# Patient Record
Sex: Female | Born: 1995 | Race: White | Hispanic: No | Smoking: Never smoker
Health system: Southern US, Community
[De-identification: ages and names within clinical notes are randomized; demographics above are authoritative.]

---

## 2013-09-29 DIAGNOSIS — F32A Depression, unspecified: Secondary | ICD-10-CM | POA: Insufficient documentation

## 2020-10-22 DIAGNOSIS — Z8759 Personal history of other complications of pregnancy, childbirth and the puerperium: Secondary | ICD-10-CM | POA: Insufficient documentation

## 2020-10-22 DIAGNOSIS — A6000 Herpesviral infection of urogenital system, unspecified: Secondary | ICD-10-CM | POA: Insufficient documentation

## 2020-10-30 DIAGNOSIS — Z1589 Genetic susceptibility to other disease: Secondary | ICD-10-CM | POA: Insufficient documentation

## 2020-11-01 ENCOUNTER — Other Ambulatory Visit: Payer: Self-pay

## 2020-11-01 ENCOUNTER — Ambulatory Visit (INDEPENDENT_AMBULATORY_CARE_PROVIDER_SITE_OTHER): Payer: Medicaid Other

## 2020-11-01 VITALS — BP 122/77 | HR 95 | Wt 176.0 lb

## 2020-11-01 DIAGNOSIS — Z3201 Encounter for pregnancy test, result positive: Secondary | ICD-10-CM | POA: Diagnosis not present

## 2020-11-01 LAB — POCT URINE PREGNANCY: Preg Test, Ur: POSITIVE — AB

## 2020-11-01 NOTE — Progress Notes (Signed)
Patient presents for UPT and it is positive. Patient has been seen at [facility] in [place] for new ob intake but didn't like the care she received there. Patient has NEw ob scheduled with [place] Nov 11. Patient given new ob packet and advised she can go to [facility] in [place] if she needs anything before her appointment. Patient states understanding. Armandina Stammer RN

## 2020-11-01 NOTE — Progress Notes (Signed)
Pt presents for UPT.

## 2020-11-28 ENCOUNTER — Other Ambulatory Visit (HOSPITAL_COMMUNITY)
Admission: RE | Admit: 2020-11-28 | Discharge: 2020-11-28 | Disposition: A | Discharge status: Home / Self Care | Payer: Medicaid Other | Source: Ambulatory Visit | Attending: Family Medicine | Admitting: Family Medicine

## 2020-11-28 ENCOUNTER — Encounter: Payer: Self-pay | Admitting: Family Medicine

## 2020-11-28 ENCOUNTER — Ambulatory Visit (INDEPENDENT_AMBULATORY_CARE_PROVIDER_SITE_OTHER): Payer: Medicaid Other | Admitting: Family Medicine

## 2020-11-28 ENCOUNTER — Other Ambulatory Visit: Payer: Self-pay

## 2020-11-28 ENCOUNTER — Encounter: Payer: Self-pay | Admitting: General Practice

## 2020-11-28 VITALS — BP 112/71 | HR 89 | Ht 60.0 in | Wt 177.0 lb

## 2020-11-28 DIAGNOSIS — Z8759 Personal history of other complications of pregnancy, childbirth and the puerperium: Secondary | ICD-10-CM

## 2020-11-28 DIAGNOSIS — O09899 Supervision of other high risk pregnancies, unspecified trimester: Secondary | ICD-10-CM | POA: Diagnosis present

## 2020-11-28 DIAGNOSIS — Z98891 History of uterine scar from previous surgery: Secondary | ICD-10-CM

## 2020-11-28 DIAGNOSIS — O0990 Supervision of high risk pregnancy, unspecified, unspecified trimester: Secondary | ICD-10-CM | POA: Insufficient documentation

## 2020-11-28 DIAGNOSIS — Z1589 Genetic susceptibility to other disease: Secondary | ICD-10-CM | POA: Diagnosis present

## 2020-11-28 DIAGNOSIS — B009 Herpesviral infection, unspecified: Secondary | ICD-10-CM

## 2020-11-28 DIAGNOSIS — Z3A10 10 weeks gestation of pregnancy: Secondary | ICD-10-CM

## 2020-11-28 DIAGNOSIS — O0991 Supervision of high risk pregnancy, unspecified, first trimester: Secondary | ICD-10-CM | POA: Diagnosis not present

## 2020-11-28 DIAGNOSIS — O09891 Supervision of other high risk pregnancies, first trimester: Secondary | ICD-10-CM | POA: Diagnosis not present

## 2020-11-28 DIAGNOSIS — Z8632 Personal history of gestational diabetes: Secondary | ICD-10-CM

## 2020-11-28 NOTE — Progress Notes (Signed)
Subjective:  Jeanette Taylor is a G2P1001 [redacted]w[redacted]d by [place] today being seen today for her first obstetrical visit.  Her obstetrical history is significant for  prior cesarean section after induction of labor due to uncontrolled A2 GDM on insulin. Had NRFHT and intrauterine inflammation and/or infection and was treated with antibiotics.  Placenta pathology showed area of infarct. Patient did have blood testing at last appt to check for hypercoagulability - was heterozygous for MTHFR. No h/o DVE. Patient does intend to breast feed. Pregnancy history fully reviewed.  Patient reports no complaints.  BP 112/71   Pulse 89   Ht 5' (1.524 m)   Wt 177 lb (80.3 kg)   LMP 08/31/2020   Breastfeeding Unknown   BMI 34.57 kg/m   HISTORY: OB History  Gravida Para Term Preterm AB Living  2 1 1     1   SAB IAB Ectopic Multiple Live Births          1    # Outcome Date GA Lbr Len/2nd Weight Sex Delivery Anes PTL Lv  2 Current           1 Term 2021 [redacted]w[redacted]d   M CS-LTranv EPI N LIV    History reviewed. No pertinent past medical history.  Past Surgical History:  Procedure Laterality Date   CESAREAN SECTION      No family history on file.   Exam  BP 112/71   Pulse 89   Ht 5' (1.524 m)   Wt 177 lb (80.3 kg)   LMP 08/31/2020   Breastfeeding Unknown   BMI 34.57 kg/m   Chaperone present during exam  CONSTITUTIONAL: Well-developed, well-nourished female in no acute distress.  HENT:  Normocephalic, atraumatic, External right and left ear normal. Oropharynx is clear and moist EYES: Conjunctivae and EOM are normal. Pupils are equal, round, and reactive to light. No scleral icterus.  NECK: Normal range of motion, supple, no masses.  Normal thyroid.  CARDIOVASCULAR: Normal heart rate noted, regular rhythm RESPIRATORY: Clear to auscultation bilaterally. Effort and breath sounds normal, no problems with respiration noted. BREASTS: Symmetric in size. No masses, skin changes, nipple drainage, or  lymphadenopathy. ABDOMEN: Soft, normal bowel sounds, no distention noted.  No tenderness, rebound or guarding.  PELVIC: Normal appearing external genitalia; normal appearing vaginal mucosa and cervix. No abnormal discharge noted.  MUSCULOSKELETAL: Normal range of motion. No tenderness.  No cyanosis, clubbing, or edema.  2+ distal pulses. SKIN: Skin is warm and dry. No rash noted. Not diaphoretic. No erythema. No pallor. NEUROLOGIC: Alert and oriented to person, place, and time. Normal reflexes, muscle tone coordination. No cranial nerve deficit noted. PSYCHIATRIC: Normal mood and affect. Normal behavior. Normal judgment and thought content.    Assessment:    Pregnancy: G2P1001 Patient Active Problem List   Diagnosis Date Noted   Supervision of other high risk pregnancies, unspecified trimester 11/28/2020   History of gestational diabetes 11/28/2020   Heterozygous MTHFR mutation C677T 10/30/2020   Genital herpes simplex 10/22/2020   History of placental abnormality 10/22/2020   Anxiety and depression 09/29/2013      Plan:   1. Supervision of high risk pregnancy, antepartum FHT normal. 11/29/2013 showed EGA [redacted]weeks. Will change EDC.  Initial labs obtained Continue prenatal vitamins Reviewed n/v relief measures and warning s/s to report Reviewed recommended weight gain based on pre-gravid BMI Encouraged well-balanced diet Genetic & carrier screening discussed: requests Panorama,  Ultrasound discussed; fetal survey: ordered CCNC completed> form faxed if has or is planning to apply  for medicaid The nature of [place] [facility] [employer] with multiple MDs and other Advanced Practice Providers was explained to patient; also emphasized that fellows, residents, and students are part of our team. - Urine Culture - Cytology - PAP( [place]) - [facility] AMB BABYSCRIPTS OPT IN - Protein / creatinine ratio, urine - TSH - [place] MFM OB DETAIL +14 WK; Future - Genetic Screening  2.  Heterozygous MTHFR mutation C677T Discussed with MFM due to h/o placental infarct. Recommended no anticoag for ppx antepartum. Will need postpartum ppx after cesarean delivery. - Urine Culture - Cytology - PAP( [place]) - [facility] AMB BABYSCRIPTS OPT IN - Protein / creatinine ratio, urine - TSH - [place] MFM OB DETAIL +14 WK; Future - Genetic Screening  3. History of placental abnormality H/o placental infarct  4. Supervision of other high risk pregnancies, unspecified trimester - Urine Culture - Cytology - PAP( ) - [facility] AMB BABYSCRIPTS OPT IN - Protein / creatinine ratio, urine - TSH - [place] MFM OB DETAIL +14 WK; Future - Genetic Screening  5. HSV infection Ppx at 35 weeks  6. History of gestational diabetes HgA1c 5.6 last month. Will check early 2hr GTT. Start ASA 81mg .  7. History of Cesarean delivery  Desires repeat  Problem list reviewed and updated. 75% of 30 min visit spent on counseling and coordination of care.     11/28/2020

## 2020-11-28 NOTE — Progress Notes (Signed)
DATING AND VIABILITY SONOGRAM   Jeanette Taylor is a 25 y.o. year old G2P1001 with LMP Patient's last menstrual period was 08/31/2020. which would correlate to  [redacted]w[redacted]d weeks gestation.  She has irregular menstrual cycles.   She is here today for a confirmatory initial sonogram.    GESTATION: SINGLETON   FETAL ACTIVITY:          Heart rate         180 bpm          The fetus is active.    ADNEXA: The ovaries are normal.   GESTATIONAL AGE AND  BIOMETRICS:  Gestational criteria: Estimated Date of Delivery: 06/25/21 by LMP now at61w5d  Previous Scans:0      CROWN RUMP LENGTH         3.19 cm         10.1 weeks   3.30 cm   10.1 weeks   2.84 cm   9.5 weeks                                                                       AVERAGE EGA(BY THIS SCAN):  10.1 weeks  WORKING EDD( early ultrasound ):  06-25-2021     TECHNICIAN COMMENTS: atient informed that the ultrasound is considered a limited obstetric ultrasound and is not intended to be a complete ultrasound exam.  Patient also informed that the ultrasound is not being completed with the intent of assessing for fetal or placental anomalies or any pelvic abnormalities. Explained that the purpose of today's ultrasound is to assess for fetal heart rate.  Patient acknowledges the purpose of the exam and the limitations of the study.    Ultrasound order not placed in PACS/Canopy/AS due to order not flowing over to ultrasound machine   Armandina Stammer 11/28/2020 9:52 AM

## 2020-11-29 LAB — PROTEIN / CREATININE RATIO, URINE
Creatinine, Urine: 38.2 mg/dL
Protein, Ur: 5.9 mg/dL
Protein/Creat Ratio: 154 mg/g creat (ref 0–200)

## 2020-11-29 LAB — CYTOLOGY - PAP
Chlamydia: NEGATIVE
Comment: NEGATIVE
Comment: NORMAL
Diagnosis: NEGATIVE
Neisseria Gonorrhea: NEGATIVE

## 2020-11-29 LAB — TSH: TSH: 2.63 u[IU]/mL (ref 0.450–4.500)

## 2020-12-04 LAB — URINE CULTURE

## 2020-12-05 ENCOUNTER — Encounter: Payer: Self-pay | Admitting: General Practice

## 2020-12-09 ENCOUNTER — Other Ambulatory Visit (INDEPENDENT_AMBULATORY_CARE_PROVIDER_SITE_OTHER): Payer: Medicaid Other

## 2020-12-09 ENCOUNTER — Other Ambulatory Visit: Payer: Self-pay

## 2020-12-09 DIAGNOSIS — Z131 Encounter for screening for diabetes mellitus: Secondary | ICD-10-CM

## 2020-12-09 DIAGNOSIS — Z3A11 11 weeks gestation of pregnancy: Secondary | ICD-10-CM

## 2020-12-09 DIAGNOSIS — R7309 Other abnormal glucose: Secondary | ICD-10-CM

## 2020-12-09 NOTE — Progress Notes (Signed)
Pt presents for early 2 hr GTT due to elevated Hemoglobin A1C. Pt was sent to the lab for her 2 hr GTT.  Jeanette Taylor l Idamae Coccia, CMA

## 2020-12-10 LAB — GLUCOSE TOLERANCE, 2 HOURS W/ 1HR
Glucose, 1 hour: 190 mg/dL — ABNORMAL HIGH (ref 70–179)
Glucose, 2 hour: 159 mg/dL — ABNORMAL HIGH (ref 70–152)
Glucose, Fasting: 93 mg/dL — ABNORMAL HIGH (ref 70–91)

## 2020-12-11 ENCOUNTER — Other Ambulatory Visit: Payer: Medicaid Other

## 2020-12-11 ENCOUNTER — Encounter: Payer: Self-pay | Admitting: Obstetrics & Gynecology

## 2020-12-11 DIAGNOSIS — O24419 Gestational diabetes mellitus in pregnancy, unspecified control: Secondary | ICD-10-CM | POA: Insufficient documentation

## 2020-12-16 ENCOUNTER — Telehealth: Payer: Self-pay

## 2020-12-16 DIAGNOSIS — O99810 Abnormal glucose complicating pregnancy: Secondary | ICD-10-CM

## 2020-12-16 DIAGNOSIS — O24419 Gestational diabetes mellitus in pregnancy, unspecified control: Secondary | ICD-10-CM

## 2020-12-16 MED ORDER — ACCU-CHEK SOFTCLIX LANCETS MISC: 1.0000 | Freq: Four times a day (QID) | 3 refills | Status: DC

## 2020-12-16 MED ORDER — ACCU-CHEK GUIDE W/DEVICE KIT: 1.0000 | PACK | Freq: Four times a day (QID) | 0 refills | Status: AC

## 2020-12-16 MED ORDER — ACCU-CHEK GUIDE VI STRP: ORAL_STRIP | 12 refills | Status: DC

## 2020-12-16 NOTE — Telephone Encounter (Signed)
Left message for patient to return call to office.  Referral placed. Diabetes supplies sent to pharmacy. Armandina Stammer RN

## 2020-12-16 NOTE — Telephone Encounter (Signed)
-----   Message from Willodean Rosenthal, MD sent at 12/11/2020  4:51 PM EST ----- Please call pt. She has GDM. She needs referral to DM educator and nutritionist. She needs to begins glc checks.   Thanks,   Clh-S

## 2020-12-25 ENCOUNTER — Other Ambulatory Visit: Payer: Self-pay

## 2020-12-25 ENCOUNTER — Ambulatory Visit: Payer: Medicaid Other | Admitting: Registered"

## 2020-12-25 ENCOUNTER — Encounter: Payer: Medicaid Other | Attending: Obstetrics & Gynecology | Admitting: Registered"

## 2020-12-25 DIAGNOSIS — Z3A14 14 weeks gestation of pregnancy: Secondary | ICD-10-CM | POA: Diagnosis not present

## 2020-12-25 DIAGNOSIS — O09892 Supervision of other high risk pregnancies, second trimester: Secondary | ICD-10-CM | POA: Diagnosis present

## 2020-12-25 DIAGNOSIS — O24419 Gestational diabetes mellitus in pregnancy, unspecified control: Secondary | ICD-10-CM | POA: Diagnosis not present

## 2020-12-25 DIAGNOSIS — Z0130 Encounter for examination of blood pressure without abnormal findings: Secondary | ICD-10-CM | POA: Diagnosis not present

## 2020-12-25 NOTE — Progress Notes (Signed)
Patient was seen for Gestational Diabetes self-management on 12/26/20  Start time 1020 and End time 1120   Estimated due date: 06/25/21; [redacted]w[redacted]d  Clinical: Medications: aspirin, prenatal vitamin Medical History: previous insulin controlled GDM 2 yrs ago Labs: OGTT [phone], A1c n/a%   Dietary and Lifestyle History: Pt states she recently moved to the area. Pt reports last pregnancy she was told that she has to eat meals and snacks on a very strict schedule even when not hungry and always include carbs. Pt states she had to use a lot of insulin to control blood sugar. Pt states she did not do 6 wk postpartum f/u last pregnancy.  Patient states she is intereted in CGM and options for insulin. RD discussed Dexcom and Omnipod.  Pt reports could not tolerate metformin.   Pt states she is tired and sleeps a lot.  Pt reports prior to previous pregnancy she followed a keto diet for about 6 months and lost weight, but it was difficult to do and was not sustainable.  Physical Activity: not assessed Stress: not assessed Sleep: not assessed  24 hr Recall: not assessed First Meal: Snack: Second meal: Snack: Third meal: Snack: Beverages:  NUTRITION INTERVENTION  Nutrition education (E-1) on the following topics:   Initial Follow-up  [x]  []  Definition of Gestational Diabetes [x]  []  Why dietary management is important in controlling blood glucose [x]  []  Effects each nutrient has on blood glucose levels [x]  []  Simple carbohydrates vs complex carbohydrates [x]  []  Fluid intake [x]  []  Creating a balanced meal plan [x]  []  Carbohydrate counting  [x]  []  When to check blood glucose levels [x]  []  Proper blood glucose monitoring techniques [x]  []  Effect of stress and stress reduction techniques  [x]  []  Exercise effect on blood glucose levels, appropriate exercise during pregnancy [x]  []  Importance of limiting caffeine and abstaining from alcohol and smoking [x]  []  Medications used for blood  sugar control during pregnancy [x]  []  Hypoglycemia and rule of 15 [x]  []  Postpartum self care  Blood glucose monitor given: Patient already has a meter, is  testing pre breakfast and 2 hours after each meal.  Started Dexcom sample: Lot # , Exp 09/16/2021  Patient instructed to monitor glucose levels: FBS: 60 - ? 95 mg/dL (some clinics use 90 for cutoff) 1 hour: ? 140 mg/dL 2 hour: ? mg/dL  Patient received handouts: Nutrition Diabetes and Pregnancy Carbohydrate Counting List  Patient will be seen for follow-up as needed.

## 2020-12-26 ENCOUNTER — Encounter: Payer: Self-pay | Admitting: Obstetrics and Gynecology

## 2020-12-26 ENCOUNTER — Ambulatory Visit (INDEPENDENT_AMBULATORY_CARE_PROVIDER_SITE_OTHER): Payer: Medicaid Other | Admitting: Obstetrics and Gynecology

## 2020-12-26 ENCOUNTER — Other Ambulatory Visit (HOSPITAL_COMMUNITY)
Admission: RE | Admit: 2020-12-26 | Discharge: 2020-12-26 | Disposition: A | Discharge status: Home / Self Care | Payer: Medicaid Other | Source: Ambulatory Visit | Attending: Obstetrics and Gynecology | Admitting: Obstetrics and Gynecology

## 2020-12-26 VITALS — BP 100/60 | HR 86 | Wt 177.0 lb

## 2020-12-26 DIAGNOSIS — Z3A14 14 weeks gestation of pregnancy: Secondary | ICD-10-CM

## 2020-12-26 DIAGNOSIS — O09899 Supervision of other high risk pregnancies, unspecified trimester: Secondary | ICD-10-CM

## 2020-12-26 DIAGNOSIS — O24419 Gestational diabetes mellitus in pregnancy, unspecified control: Secondary | ICD-10-CM

## 2020-12-26 DIAGNOSIS — O34219 Maternal care for unspecified type scar from previous cesarean delivery: Secondary | ICD-10-CM | POA: Insufficient documentation

## 2020-12-26 DIAGNOSIS — A6000 Herpesviral infection of urogenital system, unspecified: Secondary | ICD-10-CM

## 2020-12-26 NOTE — Progress Notes (Signed)
   PRENATAL VISIT NOTE  Subjective:  Jeanette Taylor is a 25 y.o. G2P1001 at [redacted]w[redacted]d being seen today for ongoing prenatal care.  She is currently monitored for the following issues for this high-risk pregnancy and has Anxiety and depression; Genital herpes simplex; Heterozygous MTHFR mutation C677T; History of placental abnormality; Supervision of other high risk pregnancies, unspecified trimester; History of gestational diabetes; Gestational diabetes; and Previous cesarean delivery affecting pregnancy on their problem list.  Patient reports no complaints.  Contractions: Not present. Vag. Bleeding: None.  Movement: Absent. Denies leaking of fluid.   The following portions of the patient's history were reviewed and updated as appropriate: allergies, current medications, past family history, past medical history, past social history, past surgical history and problem list.   Objective:   Vitals:   12/26/20 1603  BP: 100/60  Pulse: 86  Weight: 177 lb (80.3 kg)    Fetal Status: Fetal Heart Rate (bpm): 160   Movement: Absent     General:  Alert, oriented and cooperative. Patient is in no acute distress.  Skin: Skin is warm and dry. No rash noted.   Cardiovascular: Normal heart rate noted  Respiratory: Normal respiratory effort, no problems with respiration noted  Abdomen: Soft, gravid, appropriate for gestational age.  Pain/Pressure: Absent     Pelvic: Cervical exam deferred        Extremities: Normal range of motion.  Edema: None  Mental Status: Normal mood and affect. Normal behavior. Normal judgment and thought content.   Assessment and Plan:  Pregnancy: G2P1001 at [redacted]w[redacted]d 1. [redacted] weeks gestation of pregnancy  2. Supervision of other high risk pregnancies, unspecified trimester Patient is doing well without complaints Anatomy ultrasound scheduled   3. Gestational diabetes mellitus (GDM), antepartum, gestational diabetes method of control unspecified Patient seen by diabetic educator  yesterday Will have patient return with log in 2 weeks for review Patient is not interested in starting Metformin if indicated and would rather start insulin Fetal echo ordered  4. Previous cesarean delivery affecting pregnancy   5. Genital herpes simplex, unspecified site Prophylaxis if labor  Preterm labor symptoms and general obstetric precautions including but not limited to vaginal bleeding, contractions, leaking of fluid and fetal movement were reviewed in detail with the patient. Please refer to After Visit Summary for other counseling recommendations.   Return in about 2 weeks (around 01/09/2021) for in person, ROB, High risk.  Future Appointments  Date Time Provider Department Center  01/27/2021  2:30 PM [facility] NURSE [facility]  01/27/2021  2:45 PM [facility] US4 [facility]    Catalina Antigua, MD

## 2020-12-31 LAB — CERVICOVAGINAL ANCILLARY ONLY
Bacterial Vaginitis (gardnerella): POSITIVE — AB
Candida Glabrata: NEGATIVE
Candida Vaginitis: NEGATIVE
Comment: NEGATIVE
Comment: NEGATIVE
Comment: NEGATIVE

## 2020-12-31 MED ORDER — METRONIDAZOLE 500 MG PO TABS: 500.0000 mg | ORAL_TABLET | Freq: Two times a day (BID) | ORAL | 0 refills | Status: AC

## 2020-12-31 NOTE — Addendum Note (Signed)
Addended by: Catalina Antigua on: 12/31/2020 07:33 PM   Modules accepted: Orders

## 2021-01-09 ENCOUNTER — Other Ambulatory Visit: Payer: Self-pay

## 2021-01-09 ENCOUNTER — Ambulatory Visit (INDEPENDENT_AMBULATORY_CARE_PROVIDER_SITE_OTHER): Payer: Medicaid Other | Admitting: Family Medicine

## 2021-01-09 VITALS — BP 155/77 | HR 130 | Wt 176.0 lb

## 2021-01-09 DIAGNOSIS — O34219 Maternal care for unspecified type scar from previous cesarean delivery: Secondary | ICD-10-CM

## 2021-01-09 DIAGNOSIS — A6000 Herpesviral infection of urogenital system, unspecified: Secondary | ICD-10-CM

## 2021-01-09 DIAGNOSIS — O09899 Supervision of other high risk pregnancies, unspecified trimester: Secondary | ICD-10-CM

## 2021-01-09 DIAGNOSIS — O24419 Gestational diabetes mellitus in pregnancy, unspecified control: Secondary | ICD-10-CM

## 2021-01-09 DIAGNOSIS — Z1589 Genetic susceptibility to other disease: Secondary | ICD-10-CM

## 2021-01-09 DIAGNOSIS — N764 Abscess of vulva: Secondary | ICD-10-CM

## 2021-01-09 DIAGNOSIS — O162 Unspecified maternal hypertension, second trimester: Secondary | ICD-10-CM

## 2021-01-09 NOTE — Progress Notes (Signed)
° °  PRENATAL VISIT NOTE  Subjective:  Jeanette Taylor is a 25 y.o. G2P1001 at [redacted]w[redacted]d being seen today for ongoing prenatal care.  She is currently monitored for the following issues for this high-risk pregnancy and has Anxiety and depression; Genital herpes simplex; Heterozygous MTHFR mutation C677T; History of placental abnormality; Supervision of other high risk pregnancies, unspecified trimester; History of gestational diabetes; Gestational diabetes; and Previous cesarean delivery affecting pregnancy on their problem list.  Patient reports  had vulvar abscess for the past several days.  She has been sitting in the bathtub, which has been helping.  She got out of the bathtub and the abscess broke and had immediate relief of pain. .  Contractions: Not present. Vag. Bleeding: None.  Movement: Present. Denies leaking of fluid.   The following portions of the patient's history were reviewed and updated as appropriate: allergies, current medications, past family history, past medical history, past social history, past surgical history and problem list.   Objective:   Vitals:   01/09/21 1503  BP: (!) 155/77  Pulse: (!) 130  Weight: 176 lb (79.8 kg)    Fetal Status: Fetal Heart Rate (bpm): 155   Movement: Present     General:  Alert, oriented and cooperative. Patient is in no acute distress.  Skin: Skin is warm and dry. No rash noted.   Cardiovascular: Normal heart rate noted  Respiratory: Normal respiratory effort, no problems with respiration noted  Abdomen: Soft, gravid, appropriate for gestational age.  Pain/Pressure: Absent     Pelvic: Cervical exam deferred        Extremities: Normal range of motion.  Edema: None  Mental Status: Normal mood and affect. Normal behavior. Normal judgment and thought content.   Assessment and Plan:  Pregnancy: G2P1001 at [redacted]w[redacted]d 1. Supervision of other high risk pregnancies, unspecified trimester FHT and FH normal  2. Heterozygous MTHFR mutation C677T No  anticoagulation indicated during pregnancy.   3. Previous cesarean delivery affecting pregnancy  4. Gestational diabetes mellitus (GDM), antepartum, gestational diabetes method of control unspecified Had CGB - appears that fastings have been high. Will confirm with CBGs and have   5. Genital herpes simplex, unspecified site Valtrex at 35 weeks  6. Vulvar abscess Spontaneous rupture. No surrounding cellulitis. No further treatment unless it starts to worsen again  7. Elevated blood pressure affecting pregnancy in second trimester, antepartum BP noticed to be quite elevated after patient left office - will have her return for a NV to recheck.   Preterm labor symptoms and general obstetric precautions including but not limited to vaginal bleeding, contractions, leaking of fluid and fetal movement were reviewed in detail with the patient. Please refer to After Visit Summary for other counseling recommendations.   No follow-ups on file.  Future Appointments  Date Time Provider Department Center  01/27/2021  2:30 PM [facility] NURSE [facility]  01/27/2021  2:45 PM [facility]4 [facility]    Levie Heritage, DO

## 2021-01-16 ENCOUNTER — Ambulatory Visit: Payer: Medicaid Other

## 2021-01-17 ENCOUNTER — Ambulatory Visit (INDEPENDENT_AMBULATORY_CARE_PROVIDER_SITE_OTHER): Payer: Medicaid Other | Admitting: *Deleted

## 2021-01-17 ENCOUNTER — Other Ambulatory Visit: Payer: Self-pay

## 2021-01-17 VITALS — BP 126/79 | HR 103 | Ht 60.0 in | Wt 176.0 lb

## 2021-01-17 DIAGNOSIS — Z3A17 17 weeks gestation of pregnancy: Secondary | ICD-10-CM

## 2021-01-17 DIAGNOSIS — O0990 Supervision of high risk pregnancy, unspecified, unspecified trimester: Secondary | ICD-10-CM

## 2021-01-17 DIAGNOSIS — Z0130 Encounter for examination of blood pressure without abnormal findings: Secondary | ICD-10-CM

## 2021-01-17 DIAGNOSIS — O0992 Supervision of high risk pregnancy, unspecified, second trimester: Secondary | ICD-10-CM

## 2021-01-17 NOTE — Progress Notes (Signed)
° °  Subjective:  Jeanette Taylor is a 24 y.o. female here for BP check.   Hypertension ROS: taking medications as instructed, no medication side effects noted, no TIA's, no chest pain on exertion, no dyspnea on exertion, no swelling of ankles, no orthostatic dizziness or lightheadedness, no orthopnea or paroxysmal nocturnal dyspnea, and no palpitations.    Objective:  BP 126/79 (BP Location: Right Arm, Patient Position: Sitting, Cuff Size: Large)    Pulse (!) 103    Ht 5' (1.524 m)    Wt 176 lb (79.8 kg)    LMP 08/31/2020    BMI 34.37 kg/m   Appearance alert, well appearing, and in no distress, oriented to person, place, and time, and normal appearing weight. General exam BP noted to be well controlled today in office.    Assessment:   Blood Pressure well controlled, improved, and asymptomatic.   Plan:  Current treatment plan is effective, no change in therapy.  Clovis Pu, RN

## 2021-01-27 ENCOUNTER — Encounter: Payer: Self-pay | Admitting: *Deleted

## 2021-01-27 ENCOUNTER — Ambulatory Visit: Payer: Medicaid Other | Attending: Family Medicine

## 2021-01-27 ENCOUNTER — Other Ambulatory Visit: Payer: Self-pay

## 2021-01-27 ENCOUNTER — Ambulatory Visit: Payer: Medicaid Other | Admitting: *Deleted

## 2021-01-27 VITALS — BP 113/60 | HR 93

## 2021-01-27 DIAGNOSIS — Z363 Encounter for antenatal screening for malformations: Secondary | ICD-10-CM | POA: Diagnosis not present

## 2021-01-27 DIAGNOSIS — Z8632 Personal history of gestational diabetes: Secondary | ICD-10-CM | POA: Diagnosis present

## 2021-01-27 DIAGNOSIS — O24419 Gestational diabetes mellitus in pregnancy, unspecified control: Secondary | ICD-10-CM | POA: Diagnosis present

## 2021-01-27 DIAGNOSIS — Z3A18 18 weeks gestation of pregnancy: Secondary | ICD-10-CM | POA: Insufficient documentation

## 2021-01-27 DIAGNOSIS — O0990 Supervision of high risk pregnancy, unspecified, unspecified trimester: Secondary | ICD-10-CM

## 2021-01-27 DIAGNOSIS — O24414 Gestational diabetes mellitus in pregnancy, insulin controlled: Secondary | ICD-10-CM | POA: Diagnosis not present

## 2021-01-27 DIAGNOSIS — O99212 Obesity complicating pregnancy, second trimester: Secondary | ICD-10-CM | POA: Insufficient documentation

## 2021-01-27 DIAGNOSIS — O34219 Maternal care for unspecified type scar from previous cesarean delivery: Secondary | ICD-10-CM | POA: Diagnosis not present

## 2021-01-27 DIAGNOSIS — Z1589 Genetic susceptibility to other disease: Secondary | ICD-10-CM

## 2021-01-27 DIAGNOSIS — O09899 Supervision of other high risk pregnancies, unspecified trimester: Secondary | ICD-10-CM | POA: Diagnosis not present

## 2021-01-28 ENCOUNTER — Other Ambulatory Visit: Payer: Self-pay | Admitting: *Deleted

## 2021-01-28 DIAGNOSIS — O24410 Gestational diabetes mellitus in pregnancy, diet controlled: Secondary | ICD-10-CM

## 2021-01-28 DIAGNOSIS — Z362 Encounter for other antenatal screening follow-up: Secondary | ICD-10-CM

## 2021-01-29 ENCOUNTER — Other Ambulatory Visit: Payer: Self-pay

## 2021-01-29 MED ORDER — ACCU-CHEK SOFTCLIX LANCETS MISC: 1.0000 | Freq: Four times a day (QID) | 3 refills | Status: AC

## 2021-01-29 MED ORDER — ACCU-CHEK GUIDE VI STRP: ORAL_STRIP | 12 refills | Status: AC

## 2021-01-29 NOTE — Progress Notes (Signed)
Patient needs refills sent to pharmacy for her testing supplies. Kathrene Alu RN

## 2021-02-06 ENCOUNTER — Encounter: Payer: Self-pay | Admitting: Family Medicine

## 2021-02-06 ENCOUNTER — Other Ambulatory Visit: Payer: Self-pay

## 2021-02-06 ENCOUNTER — Ambulatory Visit (INDEPENDENT_AMBULATORY_CARE_PROVIDER_SITE_OTHER): Payer: Medicaid Other | Admitting: Family Medicine

## 2021-02-06 VITALS — BP 114/64 | HR 106 | Wt 176.0 lb

## 2021-02-06 DIAGNOSIS — O09899 Supervision of other high risk pregnancies, unspecified trimester: Secondary | ICD-10-CM

## 2021-02-06 DIAGNOSIS — Z3A20 20 weeks gestation of pregnancy: Secondary | ICD-10-CM

## 2021-02-06 DIAGNOSIS — F32A Depression, unspecified: Secondary | ICD-10-CM

## 2021-02-06 DIAGNOSIS — O24419 Gestational diabetes mellitus in pregnancy, unspecified control: Secondary | ICD-10-CM

## 2021-02-06 DIAGNOSIS — F419 Anxiety disorder, unspecified: Secondary | ICD-10-CM

## 2021-02-06 DIAGNOSIS — O34219 Maternal care for unspecified type scar from previous cesarean delivery: Secondary | ICD-10-CM

## 2021-02-06 DIAGNOSIS — A6000 Herpesviral infection of urogenital system, unspecified: Secondary | ICD-10-CM

## 2021-02-06 DIAGNOSIS — Z1589 Genetic susceptibility to other disease: Secondary | ICD-10-CM

## 2021-02-06 MED ORDER — LEVEMIR FLEXTOUCH 100 UNIT/ML ~~LOC~~ SOPN: 15.0000 [IU] | PEN_INJECTOR | Freq: Every day | SUBCUTANEOUS | 11 refills | Status: DC

## 2021-02-06 MED ORDER — ESCITALOPRAM OXALATE 10 MG PO TABS: 10.0000 mg | ORAL_TABLET | Freq: Every day | ORAL | 2 refills | Status: AC

## 2021-02-06 NOTE — Progress Notes (Signed)
° °  PRENATAL VISIT NOTE  Subjective:  Jeanette Taylor is a 26 y.o. G2P1001 at [redacted]w[redacted]d being seen today for ongoing prenatal care.  She is currently monitored for the following issues for this high-risk pregnancy and has Anxiety and depression; Genital herpes simplex; Heterozygous MTHFR mutation C677T; History of placental abnormality; Supervision of other high risk pregnancies, unspecified trimester; History of gestational diabetes; Gestational diabetes; and Previous cesarean delivery affecting pregnancy on their problem list.  Patient reports  increased depression - having difficulty at home, does the majority of housework and trying to do online schooling. Having difficulty getting out of bed, fatigued, increased anhedonia. On lexapro in the past and tolerated well .  Contractions: Not present. Vag. Bleeding: None.  Movement: Present. Denies leaking of fluid.   The following portions of the patient's history were reviewed and updated as appropriate: allergies, current medications, past family history, past medical history, past social history, past surgical history and problem list.   Objective:   Vitals:   02/06/21 1036  BP: 114/64  Pulse: (!) 106  Weight: 176 lb (79.8 kg)    Fetal Status: Fetal Heart Rate (bpm): 154   Movement: Present     General:  Alert, oriented and cooperative. Patient is in no acute distress.  Skin: Skin is warm and dry. No rash noted.   Cardiovascular: Normal heart rate noted  Respiratory: Normal respiratory effort, no problems with respiration noted  Abdomen: Soft, gravid, appropriate for gestational age.  Pain/Pressure: Absent     Pelvic: Cervical exam deferred        Extremities: Normal range of motion.  Edema: None  Mental Status: Normal mood and affect. Normal behavior. Normal judgment and thought content.   Assessment and Plan:  Pregnancy: G2P1001 at [redacted]w[redacted]d 1. [redacted] weeks gestation of pregnancy - AFP, Serum, Open Spina Bifida  2. Supervision of other high  risk pregnancies, unspecified trimester FHT and FH normal  3. Gestational diabetes mellitus (GDM), antepartum, gestational diabetes method of control unspecified Fastings elevated.  Also many PP after breakfasts are elevated. Some after lunch and dinner, but to a lesser degree. Will start Levemir at bedtime 0.2 units/kg - 15 units.   4. Heterozygous MTHFR mutation C677T No history of DVE  5. Previous cesarean delivery affecting pregnancy Desires repeat c/s  6. Genital herpes simplex, unspecified site PPx at 35 weeks  7. Anxiety and depression Restart lexapro  Preterm labor symptoms and general obstetric precautions including but not limited to vaginal bleeding, contractions, leaking of fluid and fetal movement were reviewed in detail with the patient. Please refer to After Visit Summary for other counseling recommendations.   No follow-ups on file.  Future Appointments  Date Time Provider [place]  02/24/2021  3:00 PM [facility] NURSE [facility]  02/24/2021  3:15 PM [facility] US2 [facility]  03/06/2021 10:55 AM Truett Mainland, DO [facility] None  04/03/2021 10:15 AM Truett Mainland, DO [facility] None  04/17/2021 10:15 AM Truett Mainland, DO [facility] None  05/01/2021 10:15 AM Anyanwu, Sallyanne Havers, MD [facility] None  05/15/2021 10:15 AM Truett Mainland, DO [facility] None    Truett Mainland, DO

## 2021-02-07 ENCOUNTER — Other Ambulatory Visit: Payer: Self-pay

## 2021-02-07 MED ORDER — SAFETY PEN NEEDLES 30G X 5 MM MISC: 1.0000 | Freq: Four times a day (QID) | 4 refills | Status: AC

## 2021-02-08 LAB — AFP, SERUM, OPEN SPINA BIFIDA
AFP MoM: 1.3
AFP Value: 70.4 ng/mL
Gest. Age on Collection Date: 20.1 weeks
Maternal Age At EDD: 27.5 yr
OSBR Risk 1 IN: 4803
Test Results:: NEGATIVE
Weight: 176 [lb_av]

## 2021-02-24 ENCOUNTER — Ambulatory Visit: Payer: Medicaid Other | Attending: Obstetrics and Gynecology

## 2021-02-24 ENCOUNTER — Encounter: Payer: Self-pay | Admitting: *Deleted

## 2021-02-24 ENCOUNTER — Ambulatory Visit: Payer: Medicaid Other | Admitting: *Deleted

## 2021-02-24 ENCOUNTER — Other Ambulatory Visit: Payer: Self-pay

## 2021-02-24 ENCOUNTER — Other Ambulatory Visit: Payer: Self-pay | Admitting: *Deleted

## 2021-02-24 VITALS — BP 119/74 | HR 88

## 2021-02-24 DIAGNOSIS — O24419 Gestational diabetes mellitus in pregnancy, unspecified control: Secondary | ICD-10-CM | POA: Diagnosis present

## 2021-02-24 DIAGNOSIS — E668 Other obesity: Secondary | ICD-10-CM

## 2021-02-24 DIAGNOSIS — O99212 Obesity complicating pregnancy, second trimester: Secondary | ICD-10-CM

## 2021-02-24 DIAGNOSIS — O24410 Gestational diabetes mellitus in pregnancy, diet controlled: Secondary | ICD-10-CM | POA: Diagnosis present

## 2021-02-24 DIAGNOSIS — Z362 Encounter for other antenatal screening follow-up: Secondary | ICD-10-CM | POA: Insufficient documentation

## 2021-02-24 DIAGNOSIS — O34219 Maternal care for unspecified type scar from previous cesarean delivery: Secondary | ICD-10-CM | POA: Diagnosis not present

## 2021-02-24 DIAGNOSIS — Z3A22 22 weeks gestation of pregnancy: Secondary | ICD-10-CM | POA: Diagnosis not present

## 2021-02-24 DIAGNOSIS — O24414 Gestational diabetes mellitus in pregnancy, insulin controlled: Secondary | ICD-10-CM

## 2021-02-24 DIAGNOSIS — Z8632 Personal history of gestational diabetes: Secondary | ICD-10-CM | POA: Diagnosis present

## 2021-02-25 ENCOUNTER — Encounter: Payer: Self-pay | Admitting: Family Medicine

## 2021-02-25 ENCOUNTER — Other Ambulatory Visit: Payer: Self-pay

## 2021-02-25 MED ORDER — VALACYCLOVIR HCL 1 G PO TABS: 1000.0000 mg | ORAL_TABLET | Freq: Every day | ORAL | 3 refills | Status: AC

## 2021-03-06 ENCOUNTER — Ambulatory Visit (INDEPENDENT_AMBULATORY_CARE_PROVIDER_SITE_OTHER): Payer: Medicaid Other | Admitting: Family Medicine

## 2021-03-06 ENCOUNTER — Other Ambulatory Visit: Payer: Self-pay

## 2021-03-06 VITALS — BP 112/67 | HR 112 | Wt 179.0 lb

## 2021-03-06 DIAGNOSIS — Z1589 Genetic susceptibility to other disease: Secondary | ICD-10-CM

## 2021-03-06 DIAGNOSIS — O09899 Supervision of other high risk pregnancies, unspecified trimester: Secondary | ICD-10-CM

## 2021-03-06 DIAGNOSIS — Z3A24 24 weeks gestation of pregnancy: Secondary | ICD-10-CM

## 2021-03-06 DIAGNOSIS — O24419 Gestational diabetes mellitus in pregnancy, unspecified control: Secondary | ICD-10-CM

## 2021-03-06 DIAGNOSIS — A6000 Herpesviral infection of urogenital system, unspecified: Secondary | ICD-10-CM

## 2021-03-06 DIAGNOSIS — O34219 Maternal care for unspecified type scar from previous cesarean delivery: Secondary | ICD-10-CM

## 2021-03-06 LAB — POCT URINALYSIS DIPSTICK
Bilirubin, UA: NEGATIVE
Blood, UA: NEGATIVE
Glucose, UA: POSITIVE — AB
Ketones, UA: NEGATIVE
Leukocytes, UA: NEGATIVE
Nitrite, UA: NEGATIVE
Protein, UA: POSITIVE — AB
Spec Grav, UA: 1.02 (ref 1.010–1.025)
Urobilinogen, UA: 0.2 E.U./dL
pH, UA: 6.5 (ref 5.0–8.0)

## 2021-03-06 MED ORDER — LEVEMIR FLEXTOUCH 100 UNIT/ML ~~LOC~~ SOPN: 15.0000 [IU] | PEN_INJECTOR | Freq: Two times a day (BID) | SUBCUTANEOUS | 11 refills | Status: DC

## 2021-03-06 NOTE — Progress Notes (Signed)
° °  PRENATAL VISIT NOTE  Subjective:  Jeanette Taylor is a 26 y.o. G2P1001 at [redacted]w[redacted]d being seen today for ongoing prenatal care.  She is currently monitored for the following issues for this high-risk pregnancy and has Anxiety and depression; Genital herpes simplex; Heterozygous MTHFR mutation C677T; History of placental abnormality; Supervision of other high risk pregnancies, unspecified trimester; History of gestational diabetes; Gestational diabetes; and Previous cesarean delivery affecting pregnancy on their problem list.  Patient reports no complaints.  Contractions: Not present. Vag. Bleeding: None.  Movement: Present. Denies leaking of fluid.   The following portions of the patient's history were reviewed and updated as appropriate: allergies, current medications, past family history, past medical history, past social history, past surgical history and problem list.   Objective:   Vitals:   03/06/21 1050  BP: 112/67  Pulse: (!) 112  Weight: 179 lb (81.2 kg)    Fetal Status: Fetal Heart Rate (bpm): 152   Movement: Present     General:  Alert, oriented and cooperative. Patient is in no acute distress.  Skin: Skin is warm and dry. No rash noted.   Cardiovascular: Normal heart rate noted  Respiratory: Normal respiratory effort, no problems with respiration noted  Abdomen: Soft, gravid, appropriate for gestational age.  Pain/Pressure: Present     Pelvic: Cervical exam deferred        Extremities: Normal range of motion.  Edema: None  Mental Status: Normal mood and affect. Normal behavior. Normal judgment and thought content.   Assessment and Plan:  Pregnancy: G2P1001 at [redacted]w[redacted]d 1. [redacted] weeks gestation of pregnancy - POCT urinalysis dipstick  2. Supervision of other high risk pregnancies, unspecified trimester  3. Gestational diabetes mellitus (GDM), antepartum, gestational diabetes method of control unspecified Most fasting controlled. Elevated PP after breakfast, some after lunch and  dinner. Increase levemir to 15units BID.  May need to add in [place]. Check with patient in 1 week. - POCT urinalysis dipstick  4. Previous cesarean delivery affecting pregnancy Desires RLTCS  5. Heterozygous MTHFR mutation C677T No h/o DVE  6. Genital herpes simplex, unspecified site   Preterm labor symptoms and general obstetric precautions including but not limited to vaginal bleeding, contractions, leaking of fluid and fetal movement were reviewed in detail with the patient. Please refer to After Visit Summary for other counseling recommendations.   No follow-ups on file.  Future Appointments  Date Time Provider [place]  03/24/2021 10:00 AM [facility] NURSE [facility]  03/24/2021 10:15 AM [facility]  04/03/2021 10:15 AM Truett Mainland, DO [facility] None  04/17/2021 10:15 AM Truett Mainland, DO [facility] None  05/01/2021 10:15 AM Anyanwu, Sallyanne Havers, MD [facility] None  05/15/2021 10:15 AM Truett Mainland, DO [facility] None    Truett Mainland, DO

## 2021-03-17 ENCOUNTER — Encounter: Payer: Self-pay | Admitting: Family Medicine

## 2021-03-21 MED ORDER — LEVEMIR FLEXTOUCH 100 UNIT/ML ~~LOC~~ SOPN: 15.0000 [IU] | PEN_INJECTOR | Freq: Two times a day (BID) | SUBCUTANEOUS | 11 refills | Status: AC

## 2021-03-24 ENCOUNTER — Other Ambulatory Visit: Payer: Self-pay

## 2021-03-24 ENCOUNTER — Other Ambulatory Visit: Payer: Self-pay | Admitting: *Deleted

## 2021-03-24 ENCOUNTER — Encounter: Payer: Self-pay | Admitting: *Deleted

## 2021-03-24 ENCOUNTER — Ambulatory Visit: Payer: Medicaid Other | Attending: Family Medicine

## 2021-03-24 ENCOUNTER — Ambulatory Visit: Payer: Medicaid Other | Admitting: *Deleted

## 2021-03-24 VITALS — BP 125/61 | HR 85

## 2021-03-24 DIAGNOSIS — E669 Obesity, unspecified: Secondary | ICD-10-CM

## 2021-03-24 DIAGNOSIS — O34219 Maternal care for unspecified type scar from previous cesarean delivery: Secondary | ICD-10-CM | POA: Insufficient documentation

## 2021-03-24 DIAGNOSIS — O24414 Gestational diabetes mellitus in pregnancy, insulin controlled: Secondary | ICD-10-CM | POA: Insufficient documentation

## 2021-03-24 DIAGNOSIS — Z8632 Personal history of gestational diabetes: Secondary | ICD-10-CM | POA: Diagnosis present

## 2021-03-24 DIAGNOSIS — O99212 Obesity complicating pregnancy, second trimester: Secondary | ICD-10-CM | POA: Diagnosis not present

## 2021-03-24 DIAGNOSIS — O24419 Gestational diabetes mellitus in pregnancy, unspecified control: Secondary | ICD-10-CM | POA: Diagnosis present

## 2021-03-24 DIAGNOSIS — Z3A26 26 weeks gestation of pregnancy: Secondary | ICD-10-CM | POA: Diagnosis not present

## 2021-04-03 ENCOUNTER — Other Ambulatory Visit: Payer: Self-pay

## 2021-04-03 ENCOUNTER — Ambulatory Visit (INDEPENDENT_AMBULATORY_CARE_PROVIDER_SITE_OTHER): Payer: Medicaid Other | Admitting: Family Medicine

## 2021-04-03 VITALS — BP 107/67 | HR 99 | Wt 182.0 lb

## 2021-04-03 DIAGNOSIS — O34219 Maternal care for unspecified type scar from previous cesarean delivery: Secondary | ICD-10-CM

## 2021-04-03 DIAGNOSIS — O24419 Gestational diabetes mellitus in pregnancy, unspecified control: Secondary | ICD-10-CM

## 2021-04-03 DIAGNOSIS — Z23 Encounter for immunization: Secondary | ICD-10-CM | POA: Diagnosis not present

## 2021-04-03 DIAGNOSIS — O98313 Other infections with a predominantly sexual mode of transmission complicating pregnancy, third trimester: Secondary | ICD-10-CM | POA: Diagnosis not present

## 2021-04-03 DIAGNOSIS — O09893 Supervision of other high risk pregnancies, third trimester: Secondary | ICD-10-CM | POA: Diagnosis not present

## 2021-04-03 DIAGNOSIS — Z3A28 28 weeks gestation of pregnancy: Secondary | ICD-10-CM

## 2021-04-03 DIAGNOSIS — Z1589 Genetic susceptibility to other disease: Secondary | ICD-10-CM

## 2021-04-03 DIAGNOSIS — O09899 Supervision of other high risk pregnancies, unspecified trimester: Secondary | ICD-10-CM

## 2021-04-03 DIAGNOSIS — A6000 Herpesviral infection of urogenital system, unspecified: Secondary | ICD-10-CM

## 2021-04-03 MED ORDER — INSULIN ASPART 100 UNIT/ML FLEXPEN: 5.0000 [IU] | PEN_INJECTOR | Freq: Three times a day (TID) | SUBCUTANEOUS | 11 refills | Status: AC

## 2021-04-03 NOTE — Progress Notes (Signed)
? ?  PRENATAL VISIT NOTE ? ?Subjective:  ?Jeanette Taylor is a 26 y.o. G2P1001 at [redacted]w[redacted]d being seen today for ongoing prenatal care.  She is currently monitored for the following issues for this high-risk pregnancy and has Anxiety and depression; Genital herpes simplex; Heterozygous MTHFR mutation C677T; History of placental abnormality; Supervision of other high risk pregnancies, unspecified trimester; History of gestational diabetes; Gestational diabetes; and Previous cesarean delivery affecting pregnancy on their problem list. ? ?Patient reports no complaints.  Contractions: Not present. Vag. Bleeding: None.  Movement: Present. Denies leaking of fluid.  ? ?The following portions of the patient's history were reviewed and updated as appropriate: allergies, current medications, past family history, past medical history, past social history, past surgical history and problem list.  ? ?Objective:  ? ?Vitals:  ? 04/03/21 1009  ?BP: 107/67  ?Pulse: 99  ?Weight: 182 lb (82.6 kg)  ? ? ?Fetal Status: Fetal Heart Rate (bpm): 140 Fundal Height: 28 cm Movement: Present    ? ?General:  Alert, oriented and cooperative. Patient is in no acute distress.  ?Skin: Skin is warm and dry. No rash noted.   ?Cardiovascular: Normal heart rate noted  ?Respiratory: Normal respiratory effort, no problems with respiration noted  ?Abdomen: Soft, gravid, appropriate for gestational age.  Pain/Pressure: Absent     ?Pelvic: Cervical exam deferred        ?Extremities: Normal range of motion.  Edema: None  ?Mental Status: Normal mood and affect. Normal behavior. Normal judgment and thought content.  ? ?Assessment and Plan:  ?Pregnancy: G2P1001 at [redacted]w[redacted]d ?1. [redacted] weeks gestation of pregnancy ?- RPR ?- HIV antibody (with reflex) ?- CBC ? ?2. Supervision of other high risk pregnancies, unspecified trimester ?FHT and FH normal. ?Patient moving to [place] next week - unexpected. Father is ill and needs to be near him to help. ? ?3. Previous cesarean delivery  affecting pregnancy ?Desires rpt C/s ? ?4. Gestational diabetes mellitus (GDM), antepartum, gestational diabetes method of control unspecified ?[place] 3/6 shows EFW 57%.  ?On levemir 20 units in AM and 15 units in PM. ?Fasting CBGs controlled. ?1hr GTT elevated PP 140-160s - start Aspart 5 units premeal. ?- RPR ?- HIV antibody (with reflex) ?- CBC ? ?5. Heterozygous MTHFR mutation C677T ?No need for ppx. ? ?6. Genital herpes simplex, unspecified site ?Suppression at 35 weeks ? ?Preterm labor symptoms and general obstetric precautions including but not limited to vaginal bleeding, contractions, leaking of fluid and fetal movement were reviewed in detail with the patient. ?Please refer to After Visit Summary for other counseling recommendations.  ? ?No follow-ups on file. ? ?No future appointments. ? ?Truett Mainland, DO ?

## 2021-04-04 LAB — CBC
Hematocrit: 29.5 % — ABNORMAL LOW (ref 34.0–46.6)
Hemoglobin: 9.7 g/dL — ABNORMAL LOW (ref 11.1–15.9)
MCH: 28.1 pg (ref 26.6–33.0)
MCHC: 32.9 g/dL (ref 31.5–35.7)
MCV: 86 fL (ref 79–97)
Platelets: 218 10*3/uL (ref 150–450)
RBC: 3.45 x10E6/uL — ABNORMAL LOW (ref 3.77–5.28)
RDW: 13.8 % (ref 11.7–15.4)
WBC: 10.9 10*3/uL — ABNORMAL HIGH (ref 3.4–10.8)

## 2021-04-04 LAB — RPR: RPR Ser Ql: NONREACTIVE

## 2021-04-04 LAB — HIV ANTIBODY (ROUTINE TESTING W REFLEX): HIV Screen 4th Generation wRfx: NONREACTIVE

## 2021-04-11 ENCOUNTER — Encounter: Payer: Self-pay | Admitting: Family Medicine

## 2021-04-17 ENCOUNTER — Encounter: Payer: Medicaid Other | Admitting: Family Medicine

## 2021-04-21 ENCOUNTER — Ambulatory Visit: Payer: Medicaid Other

## 2021-05-01 ENCOUNTER — Encounter: Payer: Medicaid Other | Admitting: Obstetrics & Gynecology

## 2021-05-15 ENCOUNTER — Encounter: Payer: Medicaid Other | Admitting: Family Medicine

## 2023-02-21 IMAGING — US US MFM OB FOLLOW-UP
1 series · 16 of 28 positions shown · non-contrast
Comparison: none

[Series 1: us mfm ob follow-up · 60 acquisitions, 16 frames shown]
[im 1/60]
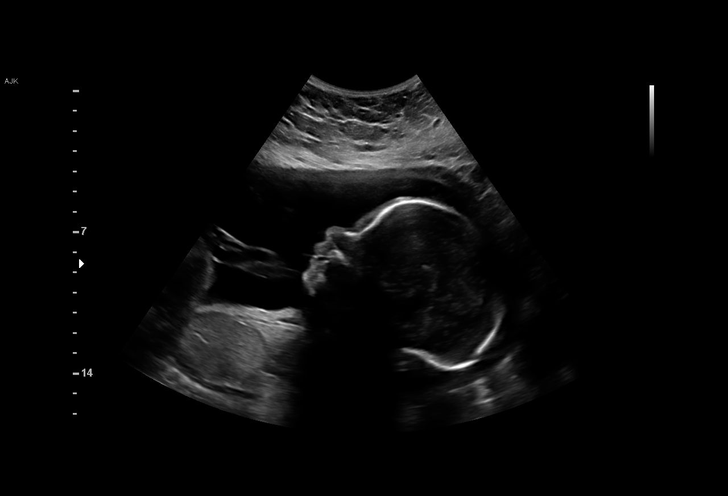
[im 5/60]
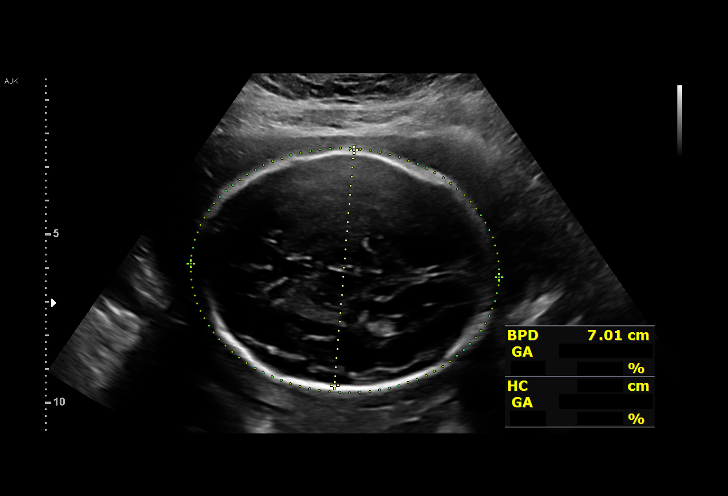
[im 9/60]
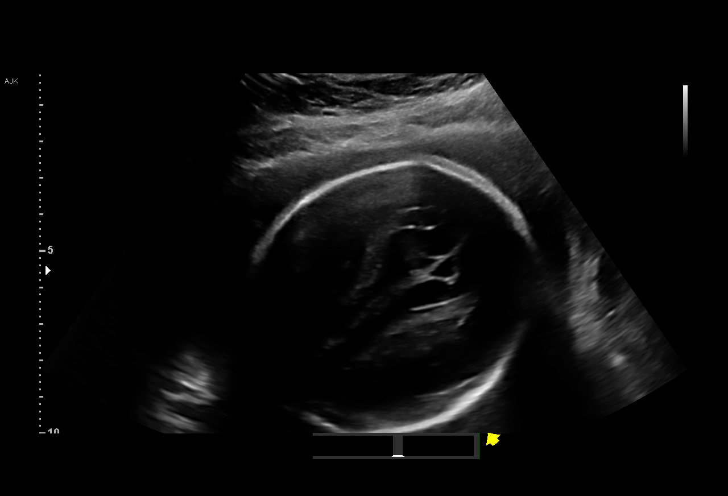
[im 14/60]
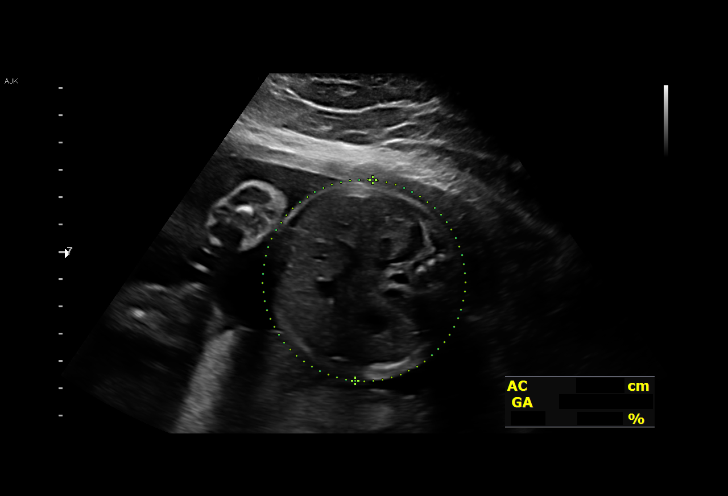
[im 16/60]
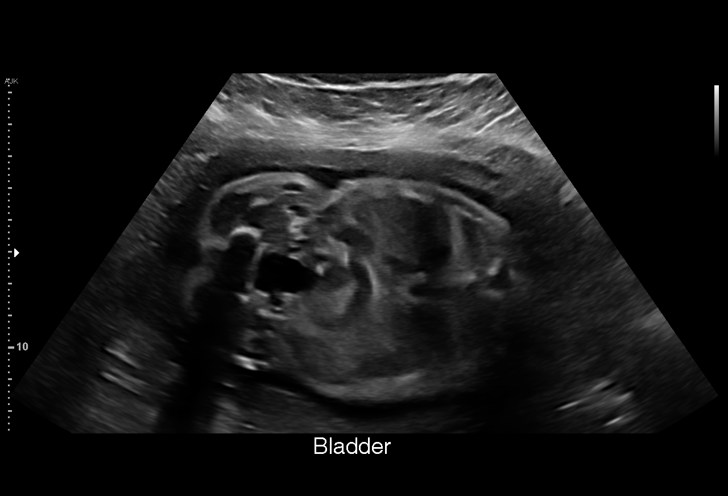
[im 20/60]
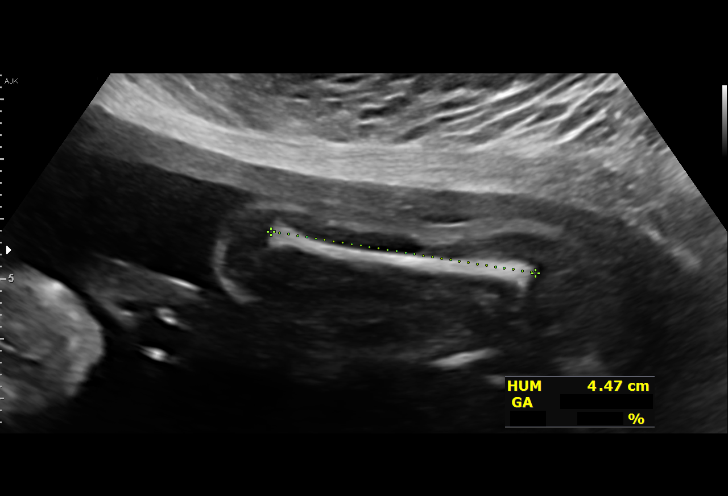
[im 25/60]
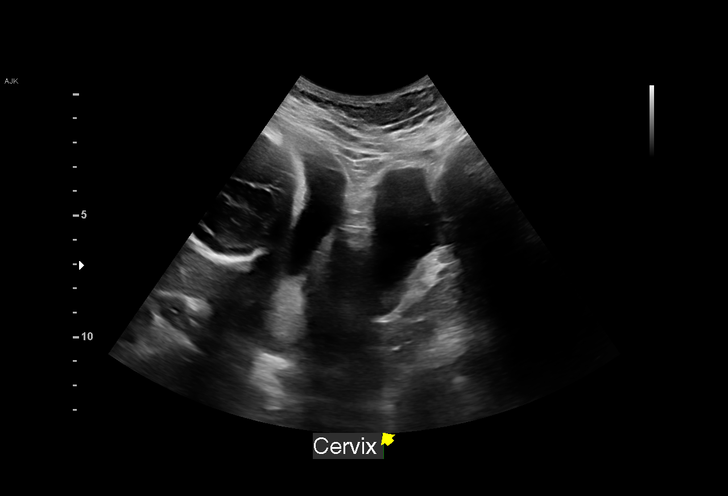
[im 29/60]
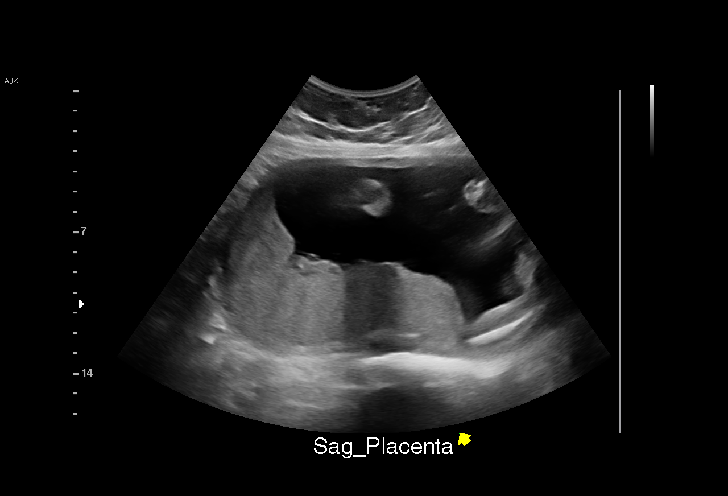
[im 31/60]
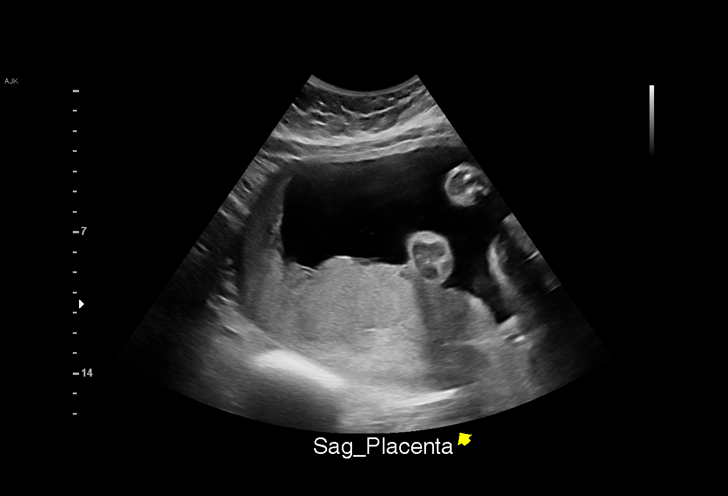
[im 35/60]
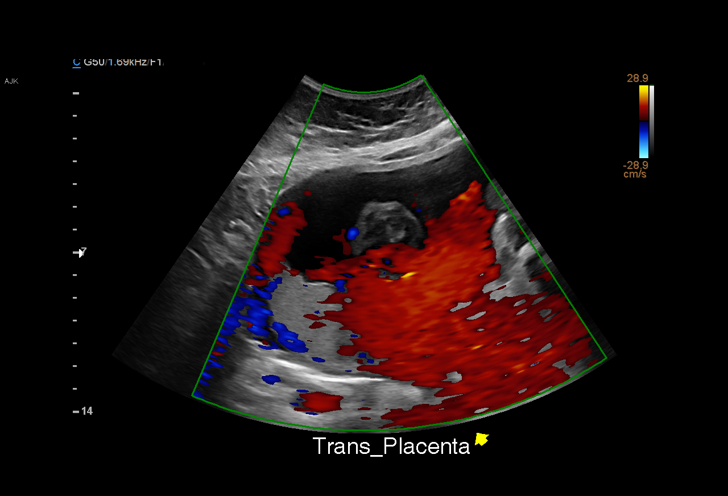
[im 40/60]
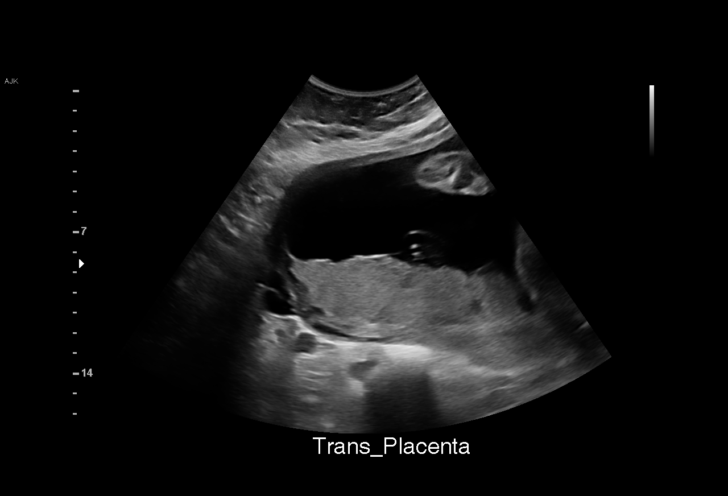
[im 44/60]
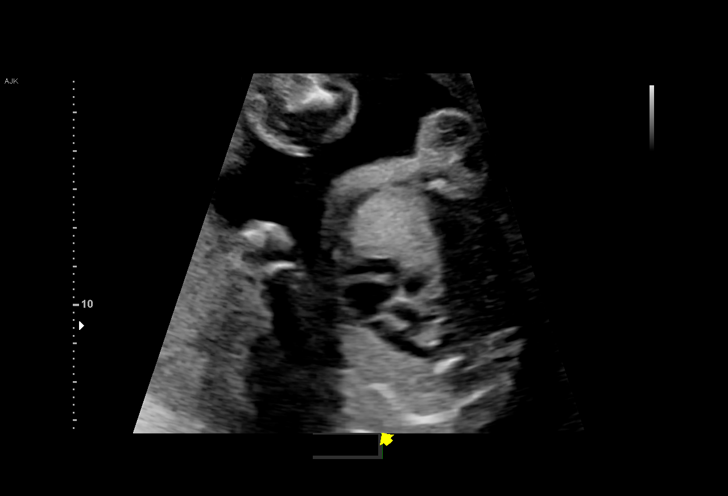
[im 46/60]
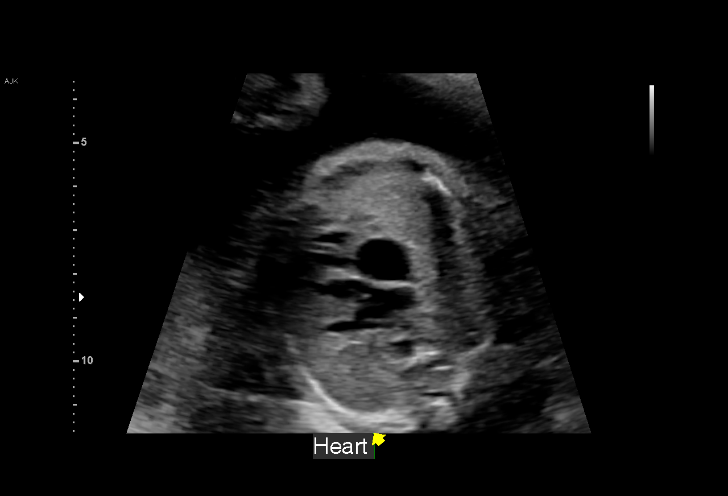
[im 51/60]
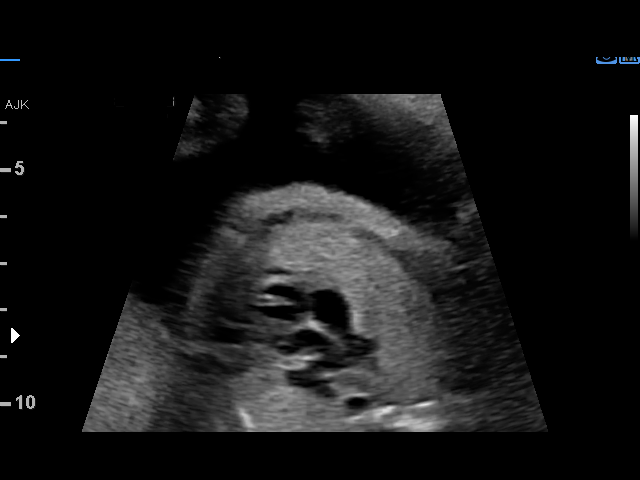
[im 55/60]
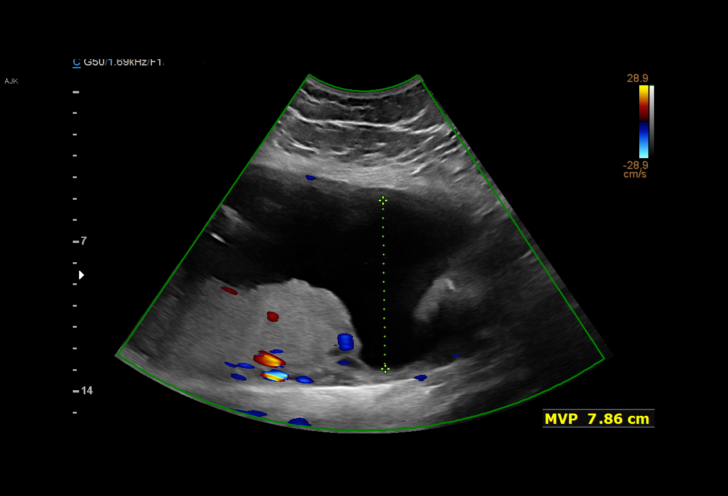
[im 60/60]
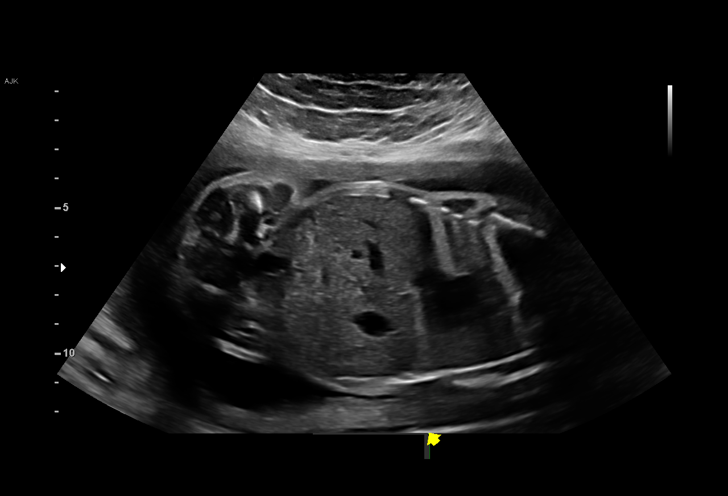

[16 of 28 positions shown; findings below may reference images not displayed]

Indications

 26 weeks gestation of pregnancy
 Gestational diabetes in pregnancy, insulin
 controlled
 History of cesarean delivery, currently
 pregnant
 Obesity complicating pregnancy, second
 trimester (BMI 34)
 Low Risk NIPS
